# Patient Record
Sex: Female | Born: 1940
Health system: Western US, Academic
[De-identification: ages and names within clinical notes are randomized; demographics above are authoritative.]

---

## 2018-04-02 LAB — OTHER LAB ORDER
Hemoglobin A1C: 6.2 % — ABNORMAL HIGH (ref 4.8–5.6)
Mean Blood Glucose Estimate: 131 mg/dL

## 2020-01-15 LAB — OTHER LAB ORDER
Hemoglobin A1C: 6.4 % — ABNORMAL HIGH (ref 4.8–5.6)
Mean Blood Glucose Estimate: 137 mg/dL

## 2021-03-06 LAB — OTHER LAB ORDER
Hemoglobin A1C: 6.5 % — ABNORMAL HIGH (ref 4.8–5.6)
Mean Blood Glucose Estimate: 140 mg/dL

## 2021-10-27 LAB — OTHER LAB ORDER
Hemoglobin A1C: 7.1 % — ABNORMAL HIGH (ref 4.8–5.6)
Mean Blood Glucose Estimate: 157 mg/dL

## 2022-02-03 ENCOUNTER — Ambulatory Visit (HOSPITAL_COMMUNITY): Payer: Medicare HMO

## 2022-02-03 ENCOUNTER — Other Ambulatory Visit (HOSPITAL_COMMUNITY): Payer: Self-pay

## 2022-02-03 ENCOUNTER — Ambulatory Visit: Payer: Medicare HMO

## 2022-02-09 ENCOUNTER — Telehealth (HOSPITAL_BASED_OUTPATIENT_CLINIC_OR_DEPARTMENT_OTHER): Payer: Self-pay

## 2022-02-09 NOTE — Telephone Encounter (Signed)
RETURN CALL: Voicemail - Detailed Message      SUBJECT:  Appointment Request     REASON FOR VISIT: referral in epic  PREFERRED DATE/TIME: After 9am   REASON UNABLE TO APPOINT: Katelyn Colon would like to schedule an appointment with provider Jeneen Montgomery. Please return call to schedule.

## 2022-04-12 ENCOUNTER — Ambulatory Visit (HOSPITAL_BASED_OUTPATIENT_CLINIC_OR_DEPARTMENT_OTHER): Payer: Medicare HMO | Admitting: Student in an Organized Health Care Education/Training Program
